# Patient Record
Sex: Male | Born: 1965 | Race: White | Hispanic: No | Marital: Married | Smoking: Current every day smoker
Health system: Southern US, Community
[De-identification: ages and names within clinical notes are randomized; demographics above are authoritative.]

## PROBLEM LIST (undated history)

## (undated) DIAGNOSIS — G4730 Sleep apnea, unspecified: Secondary | ICD-10-CM

## (undated) DIAGNOSIS — I10 Essential (primary) hypertension: Secondary | ICD-10-CM

## (undated) HISTORY — PX: ESOPHAGOGASTRODUODENOSCOPY (EGD) WITH ESOPHAGEAL DILATION: SHX5812

---

## 2012-12-31 ENCOUNTER — Other Ambulatory Visit: Payer: Self-pay | Admitting: Neurosurgery

## 2013-01-31 ENCOUNTER — Encounter (HOSPITAL_COMMUNITY): Payer: Self-pay

## 2013-02-02 ENCOUNTER — Encounter (HOSPITAL_COMMUNITY): Payer: Self-pay

## 2013-02-02 NOTE — Pre-Procedure Instructions (Signed)
Ronalee Beltsric Monterosso  02/02/2013   Your procedure is scheduled on:  Monday, February 14, 2013  [facility] [address] at [employer] AM.  Call this number if you have problems the morning of surgery: [phone]   Remember:   Do not eat food or drink liquids after midnight.   Take these medicines the morning of surgery with A SIP OF WATER: amlodipine (Norvasc) and Omeprazole  STOP taking Aspirin, Goody's, BC's, Aleve (Naproxen), Ibuprofen (Advil or Motrin), Fish Oil, Vitamins, Herbal Supplements or any substance that could thin your blood starting Monday, February 07, 2013   Do not wear jewelry.  Do not wear lotions or powders. You may wear deodorant.  Do not shave 48 hours prior to surgery. Men may shave face and neck.  Do not bring valuables to the hospital.  [facility] is not responsible for any belongings or valuables.               Contacts, dentures or bridgework may not be worn into surgery.  Leave suitcase in the car. After surgery it may be brought to your room.  For patients admitted to the hospital, discharge time is determined by your treatment team.                 Special Instructions: Shower using CHG 2 nights before surgery and the night before surgery.  If you shower the day of surgery use CHG.  Use special wash - you have one bottle of CHG for all showers.  You should use approximately 1/3 of the bottle for each shower.   Please read over the following fact sheets that you were given: Pain Booklet, Coughing and Deep Breathing, MRSA Information and Surgical Site Infection Prevention

## 2013-02-03 ENCOUNTER — Encounter (HOSPITAL_COMMUNITY)
Admission: RE | Admit: 2013-02-03 | Discharge: 2013-02-03 | Disposition: A | Discharge status: Home / Self Care | Payer: BC Managed Care – PPO | Source: Ambulatory Visit | Attending: Neurosurgery | Admitting: Neurosurgery

## 2013-02-03 ENCOUNTER — Encounter (HOSPITAL_COMMUNITY)
Admission: RE | Admit: 2013-02-03 | Discharge: 2013-02-03 | Disposition: A | Discharge status: Home / Self Care | Payer: BC Managed Care – PPO | Source: Ambulatory Visit | Attending: Anesthesiology | Admitting: Anesthesiology

## 2013-02-03 ENCOUNTER — Encounter (HOSPITAL_COMMUNITY): Payer: Self-pay

## 2013-02-03 DIAGNOSIS — Z01818 Encounter for other preprocedural examination: Secondary | ICD-10-CM | POA: Insufficient documentation

## 2013-02-03 DIAGNOSIS — Z01812 Encounter for preprocedural laboratory examination: Secondary | ICD-10-CM | POA: Insufficient documentation

## 2013-02-03 HISTORY — DX: Sleep apnea, unspecified: G47.30

## 2013-02-03 HISTORY — DX: Essential (primary) hypertension: I10

## 2013-02-03 LAB — BASIC METABOLIC PANEL
BUN: 13 mg/dL (ref 6–23)
CALCIUM: 9.7 mg/dL (ref 8.4–10.5)
CO2: 29 mEq/L (ref 19–32)
Chloride: 100 mEq/L (ref 96–112)
Creatinine, Ser: 1.04 mg/dL (ref 0.50–1.35)
GFR, EST NON AFRICAN AMERICAN: 85 mL/min — AB (ref >90)
GLUCOSE: 102 mg/dL — AB (ref 70–99)
POTASSIUM: 4.4 meq/L (ref 3.7–5.3)
Sodium: 142 mEq/L (ref 137–147)

## 2013-02-03 LAB — SURGICAL PCR SCREEN
MRSA, PCR: NEGATIVE
Staphylococcus aureus: NEGATIVE

## 2013-02-03 LAB — CBC
HCT: 47.9 % (ref 39.0–52.0)
HEMOGLOBIN: 17.2 g/dL — AB (ref 13.0–17.0)
MCH: 32.3 pg (ref 26.0–34.0)
MCHC: 35.9 g/dL (ref 30.0–36.0)
MCV: 90 fL (ref 78.0–100.0)
PLATELETS: 283 10*3/uL (ref 150–400)
RBC: 5.32 MIL/uL (ref 4.22–5.81)
RDW: 13.1 % (ref 11.5–15.5)
WBC: 8.4 10*3/uL (ref 4.0–10.5)

## 2013-02-03 NOTE — Progress Notes (Signed)
   Pt sees Dr. Carole BinningMeadows, Cardiologist, saw 2 days ago with EKG done which was requested.  Pt denies CXR, stress test, ECHO or cardiac cath.

## 2013-02-13 MED ORDER — CEFAZOLIN SODIUM-DEXTROSE 2-3 GM-% IV SOLR
2.0000 g | INTRAVENOUS | Status: AC
  Administered 2013-02-14: 2 g via INTRAVENOUS
  Filled 2013-02-13: qty 50

## 2013-02-14 ENCOUNTER — Encounter (HOSPITAL_COMMUNITY)
Admission: RE | Disposition: A | Discharge status: Home / Self Care | Payer: Self-pay | Source: Ambulatory Visit | Attending: Neurosurgery

## 2013-02-14 ENCOUNTER — Encounter (HOSPITAL_COMMUNITY): Payer: Self-pay

## 2013-02-14 ENCOUNTER — Inpatient Hospital Stay (HOSPITAL_COMMUNITY): Payer: BC Managed Care – PPO

## 2013-02-14 ENCOUNTER — Encounter (HOSPITAL_COMMUNITY): Payer: BC Managed Care – PPO | Admitting: Certified Registered Nurse Anesthetist

## 2013-02-14 ENCOUNTER — Inpatient Hospital Stay (HOSPITAL_COMMUNITY): Payer: BC Managed Care – PPO | Admitting: Certified Registered Nurse Anesthetist

## 2013-02-14 ENCOUNTER — Ambulatory Visit (HOSPITAL_COMMUNITY)
Admission: RE | Admit: 2013-02-14 | Discharge: 2013-02-15 | Disposition: A | Discharge status: Home / Self Care | Payer: BC Managed Care – PPO | Source: Ambulatory Visit | Attending: Neurosurgery | Admitting: Neurosurgery

## 2013-02-14 DIAGNOSIS — Z9119 Patient's noncompliance with other medical treatment and regimen: Secondary | ICD-10-CM | POA: Insufficient documentation

## 2013-02-14 DIAGNOSIS — Q762 Congenital spondylolisthesis: Principal | ICD-10-CM | POA: Insufficient documentation

## 2013-02-14 DIAGNOSIS — K219 Gastro-esophageal reflux disease without esophagitis: Secondary | ICD-10-CM | POA: Insufficient documentation

## 2013-02-14 DIAGNOSIS — G4730 Sleep apnea, unspecified: Secondary | ICD-10-CM | POA: Insufficient documentation

## 2013-02-14 DIAGNOSIS — F17200 Nicotine dependence, unspecified, uncomplicated: Secondary | ICD-10-CM | POA: Insufficient documentation

## 2013-02-14 DIAGNOSIS — M48061 Spinal stenosis, lumbar region without neurogenic claudication: Secondary | ICD-10-CM | POA: Insufficient documentation

## 2013-02-14 DIAGNOSIS — M4316 Spondylolisthesis, lumbar region: Secondary | ICD-10-CM

## 2013-02-14 DIAGNOSIS — E669 Obesity, unspecified: Secondary | ICD-10-CM | POA: Insufficient documentation

## 2013-02-14 DIAGNOSIS — Z91199 Patient's noncompliance with other medical treatment and regimen due to unspecified reason: Secondary | ICD-10-CM | POA: Insufficient documentation

## 2013-02-14 DIAGNOSIS — I10 Essential (primary) hypertension: Secondary | ICD-10-CM | POA: Insufficient documentation

## 2013-02-14 HISTORY — PX: MAXIMUM ACCESS (MAS)POSTERIOR LUMBAR INTERBODY FUSION (PLIF) 1 LEVEL: SHX6368

## 2013-02-14 LAB — ABO/RH: ABO/RH(D): B POS

## 2013-02-14 LAB — TYPE AND SCREEN
ABO/RH(D): B POS
Antibody Screen: NEGATIVE

## 2013-02-14 SURGERY — FOR MAXIMUM ACCESS (MAS) POSTERIOR LUMBAR INTERBODY FUSION (PLIF) 1 LEVEL
Anesthesia: General

## 2013-02-14 MED ORDER — OXYCODONE HCL 5 MG/5ML PO SOLN: 5.0000 mg | Freq: Once | ORAL | Status: AC | PRN

## 2013-02-14 MED ORDER — HYDROMORPHONE HCL PF 1 MG/ML IJ SOLN
0.2500 mg | INTRAMUSCULAR | Status: DC | PRN
  Administered 2013-02-14 (×4): 0.5 mg via INTRAVENOUS

## 2013-02-14 MED ORDER — LIDOCAINE HCL (CARDIAC) 20 MG/ML IV SOLN
INTRAVENOUS | Status: DC | PRN
Start: 2013-02-14 — End: 2013-02-14
  Administered 2013-02-14: 100 mg via INTRAVENOUS

## 2013-02-14 MED ORDER — HYDROCODONE-ACETAMINOPHEN 10-325 MG PO TABS: 2.0000 | ORAL_TABLET | Freq: Four times a day (QID) | ORAL | Status: DC | PRN

## 2013-02-14 MED ORDER — DOCUSATE SODIUM 100 MG PO CAPS
100.0000 mg | ORAL_CAPSULE | Freq: Two times a day (BID) | ORAL | Status: DC
  Administered 2013-02-14: 100 mg via ORAL
  Filled 2013-02-14 (×3): qty 1

## 2013-02-14 MED ORDER — POTASSIUM CHLORIDE IN NACL 20-0.9 MEQ/L-% IV SOLN
INTRAVENOUS | Status: DC
  Administered 2013-02-14: 19:00:00 via INTRAVENOUS
  Filled 2013-02-14 (×3): qty 1000

## 2013-02-14 MED ORDER — CYCLOBENZAPRINE HCL 10 MG PO TABS: 10.0000 mg | ORAL_TABLET | Freq: Three times a day (TID) | ORAL | Status: DC | PRN

## 2013-02-14 MED ORDER — CEFAZOLIN SODIUM 1-5 GM-% IV SOLN
INTRAVENOUS | Status: AC
  Filled 2013-02-14: qty 50

## 2013-02-14 MED ORDER — PHENOL 1.4 % MT LIQD: 1.0000 | OROMUCOSAL | Status: DC | PRN

## 2013-02-14 MED ORDER — ONDANSETRON HCL 4 MG/2ML IJ SOLN: 4.0000 mg | INTRAMUSCULAR | Status: DC | PRN

## 2013-02-14 MED ORDER — HYDROCHLOROTHIAZIDE 25 MG PO TABS
25.0000 mg | ORAL_TABLET | Freq: Every day | ORAL | Status: DC
  Filled 2013-02-14: qty 1

## 2013-02-14 MED ORDER — MEPERIDINE HCL 25 MG/ML IJ SOLN: 6.2500 mg | INTRAMUSCULAR | Status: DC | PRN

## 2013-02-14 MED ORDER — 0.9 % SODIUM CHLORIDE (POUR BTL) OPTIME
TOPICAL | Status: DC | PRN
  Administered 2013-02-14: 1000 mL

## 2013-02-14 MED ORDER — SODIUM CHLORIDE 0.9 % IJ SOLN
3.0000 mL | Freq: Two times a day (BID) | INTRAMUSCULAR | Status: DC
  Administered 2013-02-14: 3 mL via INTRAVENOUS

## 2013-02-14 MED ORDER — THROMBIN 20000 UNITS EX SOLR
CUTANEOUS | Status: DC | PRN
  Administered 2013-02-14: 08:00:00 via TOPICAL

## 2013-02-14 MED ORDER — SODIUM CHLORIDE 0.9 % IJ SOLN: 3.0000 mL | INTRAMUSCULAR | Status: DC | PRN

## 2013-02-14 MED ORDER — ALUM & MAG HYDROXIDE-SIMETH 200-200-20 MG/5ML PO SUSP: 30.0000 mL | Freq: Four times a day (QID) | ORAL | Status: DC | PRN

## 2013-02-14 MED ORDER — HYDROMORPHONE HCL PF 1 MG/ML IJ SOLN: 0.5000 mg | INTRAMUSCULAR | Status: DC | PRN

## 2013-02-14 MED ORDER — ONDANSETRON HCL 4 MG/2ML IJ SOLN
INTRAMUSCULAR | Status: DC | PRN
  Administered 2013-02-14: 4 mg via INTRAVENOUS

## 2013-02-14 MED ORDER — PANTOPRAZOLE SODIUM 40 MG PO TBEC: 40.0000 mg | DELAYED_RELEASE_TABLET | Freq: Every day | ORAL | Status: DC

## 2013-02-14 MED ORDER — LOSARTAN POTASSIUM 50 MG PO TABS
100.0000 mg | ORAL_TABLET | Freq: Every day | ORAL | Status: DC
  Filled 2013-02-14: qty 2

## 2013-02-14 MED ORDER — OXYCODONE-ACETAMINOPHEN 5-325 MG PO TABS
1.0000 | ORAL_TABLET | ORAL | Status: DC | PRN
  Administered 2013-02-14 – 2013-02-15 (×3): 2 via ORAL
  Filled 2013-02-14 (×3): qty 2

## 2013-02-14 MED ORDER — LIDOCAINE-EPINEPHRINE 1 %-1:100000 IJ SOLN
INTRAMUSCULAR | Status: DC | PRN
  Administered 2013-02-14: 4.5 mL

## 2013-02-14 MED ORDER — MENTHOL 3 MG MT LOZG: 1.0000 | LOZENGE | OROMUCOSAL | Status: DC | PRN

## 2013-02-14 MED ORDER — FENTANYL CITRATE 0.05 MG/ML IJ SOLN
INTRAMUSCULAR | Status: DC | PRN
  Administered 2013-02-14: 250 ug via INTRAVENOUS
  Administered 2013-02-14: 50 ug via INTRAVENOUS
  Administered 2013-02-14: 75 ug via INTRAVENOUS
  Administered 2013-02-14 (×2): 50 ug via INTRAVENOUS
  Administered 2013-02-14: 100 ug via INTRAVENOUS

## 2013-02-14 MED ORDER — THROMBIN 5000 UNITS EX SOLR
OROMUCOSAL | Status: DC | PRN
  Administered 2013-02-14: 08:00:00 via TOPICAL

## 2013-02-14 MED ORDER — MIDAZOLAM HCL 5 MG/5ML IJ SOLN
INTRAMUSCULAR | Status: DC | PRN
  Administered 2013-02-14: 2 mg via INTRAVENOUS

## 2013-02-14 MED ORDER — DEXAMETHASONE SODIUM PHOSPHATE 4 MG/ML IJ SOLN
INTRAMUSCULAR | Status: DC | PRN
  Administered 2013-02-14: 8 mg via INTRAVENOUS

## 2013-02-14 MED ORDER — ACETAMINOPHEN 650 MG RE SUPP: 650.0000 mg | RECTAL | Status: DC | PRN

## 2013-02-14 MED ORDER — CEFAZOLIN SODIUM 1-5 GM-% IV SOLN
1.0000 g | Freq: Three times a day (TID) | INTRAVENOUS | Status: AC
  Administered 2013-02-14 (×2): 1 g via INTRAVENOUS
  Filled 2013-02-14: qty 50

## 2013-02-14 MED ORDER — ARTIFICIAL TEARS OP OINT
TOPICAL_OINTMENT | OPHTHALMIC | Status: DC | PRN
  Administered 2013-02-14: 1 via OPHTHALMIC

## 2013-02-14 MED ORDER — HYDROMORPHONE HCL PF 1 MG/ML IJ SOLN
INTRAMUSCULAR | Status: AC
  Filled 2013-02-14: qty 1

## 2013-02-14 MED ORDER — MIDAZOLAM HCL 2 MG/2ML IJ SOLN: 0.5000 mg | Freq: Once | INTRAMUSCULAR | Status: DC | PRN

## 2013-02-14 MED ORDER — SODIUM CHLORIDE 0.9 % IR SOLN
Status: DC | PRN
  Administered 2013-02-14: 08:00:00

## 2013-02-14 MED ORDER — PROMETHAZINE HCL 25 MG/ML IJ SOLN: 6.2500 mg | INTRAMUSCULAR | Status: DC | PRN

## 2013-02-14 MED ORDER — PROPOFOL INFUSION 10 MG/ML OPTIME
INTRAVENOUS | Status: DC | PRN
  Administered 2013-02-14: 50 ug/kg/min via INTRAVENOUS

## 2013-02-14 MED ORDER — LACTATED RINGERS IV SOLN
INTRAVENOUS | Status: DC | PRN
  Administered 2013-02-14 (×3): via INTRAVENOUS

## 2013-02-14 MED ORDER — PROPOFOL 10 MG/ML IV BOLUS
INTRAVENOUS | Status: DC | PRN
  Administered 2013-02-14: 200 mg via INTRAVENOUS

## 2013-02-14 MED ORDER — OXYCODONE HCL 5 MG PO TABS
ORAL_TABLET | ORAL | Status: AC
  Filled 2013-02-14: qty 1

## 2013-02-14 MED ORDER — LOSARTAN POTASSIUM-HCTZ 100-25 MG PO TABS: 1.0000 | ORAL_TABLET | Freq: Every day | ORAL | Status: DC

## 2013-02-14 MED ORDER — SUCCINYLCHOLINE CHLORIDE 20 MG/ML IJ SOLN
INTRAMUSCULAR | Status: DC | PRN
  Administered 2013-02-14: 100 mg via INTRAVENOUS

## 2013-02-14 MED ORDER — OMEPRAZOLE MAGNESIUM 20.6 (20 BASE) MG PO CPDR: 20.6000 mg | DELAYED_RELEASE_CAPSULE | Freq: Every day | ORAL | Status: DC

## 2013-02-14 MED ORDER — AMLODIPINE BESYLATE 10 MG PO TABS
10.0000 mg | ORAL_TABLET | Freq: Every day | ORAL | Status: DC
  Filled 2013-02-14: qty 1

## 2013-02-14 MED ORDER — OXYCODONE HCL 5 MG PO TABS
5.0000 mg | ORAL_TABLET | Freq: Once | ORAL | Status: AC | PRN
  Administered 2013-02-14: 5 mg via ORAL

## 2013-02-14 MED ORDER — BUPIVACAINE HCL (PF) 0.25 % IJ SOLN
INTRAMUSCULAR | Status: DC | PRN
  Administered 2013-02-14: 4.5 mL

## 2013-02-14 MED ORDER — ACETAMINOPHEN 325 MG PO TABS: 650.0000 mg | ORAL_TABLET | ORAL | Status: DC | PRN

## 2013-02-14 SURGICAL SUPPLY — 69 items
BAG DECANTER FOR FLEXI CONT (MISCELLANEOUS) ×2 IMPLANT
BENZOIN TINCTURE PRP APPL 2/3 (GAUZE/BANDAGES/DRESSINGS) ×2 IMPLANT
BLADE SURG ROTATE 9660 (MISCELLANEOUS) IMPLANT
BONE MATRIX OSTEOCEL PRO SM (Bone Implant) ×4 IMPLANT
BUR MATCHSTICK NEURO 3.0 LAGG (BURR) ×2 IMPLANT
CAGE COROENT 14X9X28-8 (Cage) ×4 IMPLANT
CANISTER SUCT 3000ML (MISCELLANEOUS) ×2 IMPLANT
CLIP NEUROVISION LG (CLIP) ×2 IMPLANT
CONT SPEC 4OZ CLIKSEAL STRL BL (MISCELLANEOUS) ×4 IMPLANT
COVER BACK TABLE 24X17X13 BIG (DRAPES) IMPLANT
COVER TABLE BACK 60X90 (DRAPES) ×2 IMPLANT
DERMABOND ADHESIVE PROPEN (GAUZE/BANDAGES/DRESSINGS) ×1
DERMABOND ADVANCED .7 DNX6 (GAUZE/BANDAGES/DRESSINGS) ×1 IMPLANT
DRAPE C-ARM 42X72 X-RAY (DRAPES) ×2 IMPLANT
DRAPE C-ARMOR (DRAPES) ×2 IMPLANT
DRAPE LAPAROTOMY 100X72X124 (DRAPES) ×2 IMPLANT
DRAPE POUCH INSTRU U-SHP 10X18 (DRAPES) ×2 IMPLANT
DRAPE SURG 17X23 STRL (DRAPES) ×2 IMPLANT
DRESSING TELFA 8X3 (GAUZE/BANDAGES/DRESSINGS) ×2 IMPLANT
DRSG OPSITE 4X5.5 SM (GAUZE/BANDAGES/DRESSINGS) ×2 IMPLANT
DRSG OPSITE POSTOP 4X6 (GAUZE/BANDAGES/DRESSINGS) ×2 IMPLANT
DURAPREP 26ML APPLICATOR (WOUND CARE) ×2 IMPLANT
ELECT REM PT RETURN 9FT ADLT (ELECTROSURGICAL) ×2
ELECTRODE REM PT RTRN 9FT ADLT (ELECTROSURGICAL) ×1 IMPLANT
EVACUATOR 1/8 PVC DRAIN (DRAIN) ×2 IMPLANT
GAUZE SPONGE 4X4 16PLY XRAY LF (GAUZE/BANDAGES/DRESSINGS) IMPLANT
GLOVE BIO SURGEON STRL SZ8 (GLOVE) ×4 IMPLANT
GLOVE ECLIPSE 7.5 STRL STRAW (GLOVE) ×2 IMPLANT
GLOVE EXAM NITRILE LRG STRL (GLOVE) ×4 IMPLANT
GLOVE INDICATOR 7.0 STRL GRN (GLOVE) ×2 IMPLANT
GLOVE INDICATOR 8.5 STRL (GLOVE) ×2 IMPLANT
GLOVE SURG SS PI 6.5 STRL IVOR (GLOVE) ×6 IMPLANT
GOWN BRE IMP SLV AUR LG STRL (GOWN DISPOSABLE) IMPLANT
GOWN BRE IMP SLV AUR XL STRL (GOWN DISPOSABLE) IMPLANT
GOWN STRL REIN 2XL LVL4 (GOWN DISPOSABLE) IMPLANT
GOWN STRL REUS W/ TWL XL LVL3 (GOWN DISPOSABLE) ×2 IMPLANT
GOWN STRL REUS W/TWL XL LVL3 (GOWN DISPOSABLE) ×2
HEMOSTAT POWDER KIT SURGIFOAM (HEMOSTASIS) IMPLANT
KIT BASIN OR (CUSTOM PROCEDURE TRAY) ×2 IMPLANT
KIT NEEDLE NVM5 EMG ELECT (KITS) ×1 IMPLANT
KIT NEEDLE NVM5 EMG ELECTRODE (KITS) ×1
KIT ROOM TURNOVER OR (KITS) ×2 IMPLANT
MILL MEDIUM DISP (BLADE) ×2 IMPLANT
NEEDLE HYPO 25X1 1.5 SAFETY (NEEDLE) ×2 IMPLANT
NS IRRIG 1000ML POUR BTL (IV SOLUTION) ×2 IMPLANT
PACK LAMINECTOMY NEURO (CUSTOM PROCEDURE TRAY) ×2 IMPLANT
PAD ARMBOARD 7.5X6 YLW CONV (MISCELLANEOUS) ×6 IMPLANT
PUTTY BONE DBX 2.5 MIS (Bone Implant) ×2 IMPLANT
ROD 5.5X40MM (Rod) ×4 IMPLANT
SCREW LOCK (Screw) ×4 IMPLANT
SCREW LOCK FXNS SPNE MAS PL (Screw) ×4 IMPLANT
SCREW PLIF MAS 5.0X35 (Screw) ×4 IMPLANT
SCREW SHANK 5.0X35 (Screw) ×4 IMPLANT
SCREW TULIP 5.5 (Screw) ×4 IMPLANT
SPONGE GAUZE 4X4 12PLY (GAUZE/BANDAGES/DRESSINGS) ×2 IMPLANT
SPONGE LAP 4X18 X RAY DECT (DISPOSABLE) IMPLANT
SPONGE SURGIFOAM ABS GEL 100 (HEMOSTASIS) ×2 IMPLANT
STRIP CLOSURE SKIN 1/2X4 (GAUZE/BANDAGES/DRESSINGS) ×2 IMPLANT
SUT VIC AB 0 CT1 18XCR BRD8 (SUTURE) ×1 IMPLANT
SUT VIC AB 0 CT1 8-18 (SUTURE) ×1
SUT VIC AB 2-0 CT1 18 (SUTURE) ×2 IMPLANT
SUT VICRYL 4-0 PS2 18IN ABS (SUTURE) ×2 IMPLANT
SYR 20ML ECCENTRIC (SYRINGE) ×2 IMPLANT
TAPE CLOTH SURG 4X10 WHT LF (GAUZE/BANDAGES/DRESSINGS) ×2 IMPLANT
TOWEL OR 17X24 6PK STRL BLUE (TOWEL DISPOSABLE) ×2 IMPLANT
TOWEL OR 17X26 10 PK STRL BLUE (TOWEL DISPOSABLE) ×2 IMPLANT
TRAY FOLEY CATH 14FRSI W/METER (CATHETERS) ×2 IMPLANT
TRAY FOLEY CATH 16FRSI W/METER (SET/KITS/TRAYS/PACK) ×2 IMPLANT
WATER STERILE IRR 1000ML POUR (IV SOLUTION) ×2 IMPLANT

## 2013-02-14 NOTE — Transfer of Care (Signed)
Immediate Anesthesia Transfer of Care Note  Patient: Alex Mason  Procedure(s) Performed: Procedure(s) with comments: FOR MAXIMUM ACCESS (MAS) LUMBAR FOUR TO LUMBAR FIVE POSTERIOR LUMBAR INTERBODY FUSION (PLIF) 1 LEVEL (N/A) - L4-5 MAS Posterior lumbar interbody fusion  Patient Location: PACU  Anesthesia Type:General  Level of Consciousness: sedated and responds to stimulation  Airway & Oxygen Therapy: Patient Spontanous Breathing and Patient connected to face mask oxygen  Post-op Assessment: Report given to PACU RN, Post -op Vital signs reviewed and stable and Patient moving all extremities X 4  Post vital signs: Reviewed and stable  Complications: No apparent anesthesia complications

## 2013-02-14 NOTE — Anesthesia Preprocedure Evaluation (Addendum)
Anesthesia Evaluation  Patient identified by MRN, date of birth, ID band Patient awake    Reviewed: Allergy & Precautions, H&P , NPO status , Patient's Chart, lab work & pertinent test results  History of Anesthesia Complications Negative for: history of anesthetic complications  Airway Mallampati: I TM Distance: >3 FB Neck ROM: Full    Dental  (+) Dental Advisory Given and Teeth Intact   Pulmonary sleep apnea (does not use CPAP) , Current Smoker,  Noncompliant with CPAP breath sounds clear to auscultation  Pulmonary exam normal       Cardiovascular hypertension, Pt. on medications Rhythm:Regular Rate:Normal     Neuro/Psych LLE numbness, tingling, weakness    GI/Hepatic Neg liver ROS, GERD-  Medicated and Controlled,H/o esophageal dilatation--2010   Endo/Other  negative endocrine ROSMorbid obesity  Renal/GU negative Renal ROS  negative genitourinary   Musculoskeletal negative musculoskeletal ROS (+)   Abdominal (+) + obese,   Peds  Hematology negative hematology ROS (+)   Anesthesia Other Findings   Reproductive/Obstetrics                          Anesthesia Physical Anesthesia Plan  ASA: III  Anesthesia Plan: General   Post-op Pain Management:    Induction: Intravenous  Airway Management Planned: Oral ETT  Additional Equipment:   Intra-op Plan:   Post-operative Plan: Extubation in OR  Informed Consent: I have reviewed the patients History and Physical, chart, labs and discussed the procedure including the risks, benefits and alternatives for the proposed anesthesia with the patient or authorized representative who has indicated his/her understanding and acceptance.   Dental advisory given  Plan Discussed with: CRNA, Anesthesiologist and Surgeon  Anesthesia Plan Comments: (Plan routine monitors, GETA)       Anesthesia Quick Evaluation

## 2013-02-14 NOTE — H&P (Signed)
Alex Mason is an 48 y.o. male.   Chief Complaint: Back and left leg pain HPI: Patient is a very pleasant 48 year old gentleman who has had progressive worsening back and predominantly right leg pain rate down the back of his leg back of his calf and stop his foot his big toe. On clinical exam patient had weakness in his great toe patient been refractory to all forms of conservative treatment imaging revealed severe stenosis lateral recess stenosis from a grade 1 spondylolisthesis L4-5 and due to patient's failure conservative treatment progression of clinical syndrome and imaging findings have recommended decompression stabilization procedure at L4-5 I extensively reviewed the risks and benefits of the operation with the patient as well as perioperative course and expectations of outcome alternatives of surgery he understands and agrees to proceed forward.  Past Medical History  Diagnosis Date  . Hypertension   . Sleep apnea     +sleep apnea, not able to be successful with CPAP    Past Surgical History  Procedure Laterality Date  . Esophagogastroduodenoscopy (egd) with esophageal dilation      No family history on file. Social History:  reports that he has been smoking Cigarettes.  He has been smoking about 1.50 packs per day. He has never used smokeless tobacco. He reports that he drinks about 1.2 ounces of alcohol per week. He reports that he does not use illicit drugs.  Allergies: No Known Allergies  Medications Prior to Admission  Medication Sig Dispense Refill  . amLODipine (NORVASC) 10 MG tablet Take 10 mg by mouth daily.      Marland Kitchen HYDROcodone-acetaminophen (NORCO) 10-325 MG per tablet Take 2 tablets by mouth every 6 (six) hours as needed.      Marland Kitchen losartan-hydrochlorothiazide (HYZAAR) 100-25 MG per tablet Take 1 tablet by mouth daily.      . Omeprazole Magnesium 20.6 (20 BASE) MG CPDR Take 20.6 mg by mouth daily.        Results for orders placed during the hospital encounter of  02/14/13 (from the past 48 hour(s))  TYPE AND SCREEN     Status: None   Collection Time    02/14/13  6:13 AM      Result Value Range   ABO/RH(D) B POS     Antibody Screen PENDING     Sample Expiration 02/17/2013     No results found.  Review of Systems  Constitutional: Negative.   HENT: Negative.   Eyes: Negative.   Respiratory: Negative.   Cardiovascular: Negative.   Gastrointestinal: Negative.   Genitourinary: Negative.   Musculoskeletal: Positive for back pain and myalgias.  Skin: Negative.   Neurological: Positive for tingling and sensory change.  Psychiatric/Behavioral: Negative.     Blood pressure 127/90, pulse 81, temperature 97.6 F (36.4 C), resp. rate 20, SpO2 98.00%. Physical Exam  Constitutional: He is oriented to person, place, and time. He appears well-developed and well-nourished.  Eyes: Pupils are equal, round, and reactive to light.  Neck: Normal range of motion.  Respiratory: Effort normal.  GI: Soft.  Neurological: He is alert and oriented to person, place, and time. He has normal strength. GCS eye subscore is 4. GCS verbal subscore is 5. GCS motor subscore is 6.  Reflex Scores:      Patellar reflexes are 0 on the right side and 0 on the left side.      Achilles reflexes are 0 on the right side and 0 on the left side. Strength is 5 out of 5 in his  iliopsoas, quads, and she's, gastrocs, the left EHL is weak at 4/5 the right EHL is 5 out of 5  Skin: Skin is warm and dry.     Assessment/Plan 48 year old presents for decompression stabilization procedure at L4-5  Hollynn Garno P 02/14/2013, 7:13 AM

## 2013-02-14 NOTE — Preoperative (Signed)
Beta Blockers   Reason not to administer Beta Blockers:Not Applicable 

## 2013-02-14 NOTE — Progress Notes (Signed)
Orthopedic Tech Progress Note Patient Details:  Alex Mason 1965-12-21 [id number] CalledRonalee Mason bio-tech for brace Patient ID: Alex BeltsEric Mason, male   DOB: 1965-12-21, 48 y.o.   MRN: [id number]   Alex MoccasinHughes, Alex Zietz [name] 02/14/2013, 4:37 PM

## 2013-02-14 NOTE — Plan of Care (Signed)
Problem: Consults Goal: Diagnosis - Spinal Surgery Outcome: Completed/Met Date Met:  02/14/13 Thoraco/Lumbar Spine Fusion

## 2013-02-14 NOTE — Op Note (Signed)
Preoperative diagnosis: Grade 1 spondylolisthesis and lumbar spinal stenosis L4-5 left-sided L4 and L5 radiculopathy  Postoperative diagnosis: Same  Procedure: Decompressive lumbar laminectomy and posterior lumbar interbody fusion L4-5 using the invasive peek cages packed with local autograft mixed with DBX and OsteoSet less than and and cortical screw fixation using a 5.5 invasive cortical screw system with 50 by 35 mm cortical screws at L4 and L5  Surgeon: Jillyn Hidden Roseanna Koplin  Assistant: Colon Branch  Anesthesia: Gen.  EBL: Minimal  History of present illness: Patient is a very pleasant 47 to gentleman has had progressive worsening back and left greater right leg pain patient reports pain that would radiate down, and L4 and L5 nerve root pattern had weakness in his left great toe on physical exam and failed all forms of conservative treatments are recommended decompression stabilization procedure I extensively reviewed the risks and benefits of the operation the patient as well as perioperative course and expectations of outcome and alternatives surgery he understood and agreed to proceed forward.  Operative procedure: Patient brought into the or was induced under general anesthesia positioned prone on the Wilson frame his back was prepped and draped in routine sterile fashion preoperative x-ray localize the appropriate level so after infiltration of 10 cc lidocaine with epi over the cover C6 and subcutaneous tissues and subperiosteal dissections care lamina of L4 and expose the facet complexes at L3-4 and L4-5. The entry points were first identified along the lateral wall the pars NA 5:00 and 7:00 position respectively into the L4 pedicle. Using APL and lateral fluoroscopy pilot holes were drilled and under direct neural stimulation to 35 mm cortical screw holes were drilled tapped probed and 50 by 35 mm cortical screws inserted L4 bilaterally at this point the inferior aspect approximately 2 mm inferior  screw of the pars was drilled away to marked the superior endplate of decompression complete central decompression was begun with complete medial facetectomies the foramina at L4 and L5 were identified and the L4 and L5 nerve roots are radically decompressed and extensively under biting the superior tickling facet of L5 to gain access lateral margins of the disc space. The spaces and cleaned out with 200 stretching curettes rotating cutters and rasps after adequate endplate preparation been achieved and with the distractor in place in appropriate sizing for the graft after placement of a trial confirmed to be a 14 mm 8 lordosis implant with 20 mm in length this is packed with local autograft mixed with DBX and OsteoSet plus fluoroscopy used the step along the way after the initial implant and placed the disc spaces cleanout the opposite side as well as all central endplate local autograft mixed DBX OsteoSet was packed centrally and then the contralateral cage was inserted additional bone graft mixed was packed laterally to the cage and then attention second of the L5 screw placement using a strata trajectory under direct visualization of the medial pedicle wall directing slightly mediolateral L5 cortical screws were also placed and again 50 by 35 mm screws are placed the anterolisthesis screws were placed AP lateral fluoroscopy confirmed good position of the implants the wounds and copiously are good fixing space was maintained all the foraminal reinspected confirm patency no migration of graft material the head were assembled 40 mm rods were placed top tightness tightened down in place. Gelfoam was laid up the dura and the muscle fascia of her pressure layers with after Vicryl and the skin was closed running 4 subcuticular benzoin Steri-Strips applied patient recovered  in stable condition. At the end of case on it counts sponge counts were correct.

## 2013-02-14 NOTE — Progress Notes (Signed)
Orthopedic Tech Progress Note Patient Details:  Alex Mason 02-24-1965 [id number] Brace order completed by bio-tech Patient ID: Alex Mason, male   DOB: 02-24-1965, 48 y.o.   MRN: [id number]   Jennye MoccasinHughes, Tatayana Beshears [name] 02/14/2013, 8:44 PM

## 2013-02-14 NOTE — Anesthesia Postprocedure Evaluation (Signed)
  Anesthesia Post-op Note  Patient: Alex Mason  Procedure(s) Performed: Procedure(s) with comments: FOR MAXIMUM ACCESS (MAS) LUMBAR FOUR TO LUMBAR FIVE POSTERIOR LUMBAR INTERBODY FUSION (PLIF) 1 LEVEL (N/A) - L4-5 MAS Posterior lumbar interbody fusion  Patient Location: PACU  Anesthesia Type:General  Level of Consciousness: awake, alert , oriented and patient cooperative  Airway and Oxygen Therapy: Patient Spontanous Breathing and Patient connected to nasal cannula oxygen  Post-op Pain: mild  Post-op Assessment: Post-op Vital signs reviewed, Patient's Cardiovascular Status Stable, Respiratory Function Stable, Patent Airway, No signs of Nausea or vomiting and Pain level controlled  Post-op Vital Signs: Reviewed and stable  Complications: No apparent anesthesia complications

## 2013-02-15 MED ORDER — CYCLOBENZAPRINE HCL 10 MG PO TABS: 10.0000 mg | ORAL_TABLET | Freq: Three times a day (TID) | ORAL | Status: AC | PRN

## 2013-02-15 MED ORDER — OXYCODONE-ACETAMINOPHEN 5-325 MG PO TABS: 1.0000 | ORAL_TABLET | ORAL | Status: AC | PRN

## 2013-02-15 NOTE — Progress Notes (Signed)
Pt. Alert and oriented,follows simple instructions, denies pain. Incision area without swelling, redness or S/S of infection. Voiding adequate clear yellow urine. Moving all extremities well and vitals stable and documented. Patient discharged home with spouse per order.  Lumbar surgery notes instructions given to patient and family member for home safety and precautions. Pt. and family stated understanding of instructions given.

## 2013-02-15 NOTE — Discharge Summary (Signed)
  Physician Discharge Summary  Patient ID: Alex Mason MRN: [id number] DOB/AGE: [birth year] 46 y.o.  Admit date: 02/14/2013 Discharge date: 02/15/2013  Admission Diagnoses: Grade 1 spondylolisthesis L4-5  Discharge Diagnoses: Same Active Problems:   Spondylolisthesis at L4-L5 level   Discharged Condition: good  Hospital Course: [facility] a decompressive laminectomy and fusion at L4-5 postoperatively patient did very well with recovered in the floor on the floor he was ambulating and voiding spontaneously tolerating rigid diet and was stable to be discharged home. Patient be stable was scheduled followup approximately 2 weeks the  Consults: Significant Diagnostic Studies: Treatments: Decompressive lumbar laminectomy and posterior lumbar interbody fusion L4-5 Discharge Exam: Blood pressure 117/79, pulse 70, temperature 97.6 F (36.4 C), temperature source Oral, resp. rate 16, SpO2 94.00%. Strength out of 5 wound clean and dry  Disposition: Home     Medication List         amLODipine 10 MG tablet  Commonly known as:  NORVASC  Take 10 mg by mouth daily.     cyclobenzaprine 10 MG tablet  Commonly known as:  FLEXERIL  Take 1 tablet (10 mg total) by mouth 3 (three) times daily as needed for muscle spasms.     HYDROcodone-acetaminophen 10-325 MG per tablet  Commonly known as:  NORCO  Take 2 tablets by mouth every 6 (six) hours as needed.     losartan-hydrochlorothiazide 100-25 MG per tablet  Commonly known as:  HYZAAR  Take 1 tablet by mouth daily.     Omeprazole Magnesium 20.6 (20 BASE) MG Cpdr  Take 20.6 mg by mouth daily.     oxyCODONE-acetaminophen 5-325 MG per tablet  Commonly known as:  PERCOCET/ROXICET  Take 1-2 tablets by mouth every 4 (four) hours as needed for moderate pain.           Follow-up Information   [facility]onette Grosso P, MD.   Specialty:  Neurosurgery   Contact information:   [address] [place] [id number] [phone]       Signed: Devynn Hessler P 02/15/2013, 6:39 AM

## 2013-02-15 NOTE — Discharge Instructions (Signed)

## 2013-02-15 NOTE — Progress Notes (Signed)
Patient ID: Alex Mason, male   DOB: March 03, 1965, 48 y.o.   MRN: [id number] Patient is doing great no leg pain back pain is well controlled on pills. He is ambulating and voiding  Strength is 5 out of 5 wound is clean and dry  DC'd his Hemovac discharge home

## 2013-02-18 ENCOUNTER — Encounter (HOSPITAL_COMMUNITY): Payer: Self-pay | Admitting: Neurosurgery

## 2013-11-23 ENCOUNTER — Other Ambulatory Visit (HOSPITAL_COMMUNITY): Payer: Self-pay | Admitting: Neurosurgery

## 2013-11-23 ENCOUNTER — Ambulatory Visit (HOSPITAL_COMMUNITY)
Admission: RE | Admit: 2013-11-23 | Discharge: 2013-11-23 | Disposition: A | Discharge status: Home / Self Care | Payer: BC Managed Care – PPO | Source: Ambulatory Visit | Attending: Vascular Surgery | Admitting: Vascular Surgery

## 2013-11-23 DIAGNOSIS — M79605 Pain in left leg: Secondary | ICD-10-CM | POA: Insufficient documentation

## 2013-11-23 DIAGNOSIS — R52 Pain, unspecified: Secondary | ICD-10-CM

## 2014-09-12 IMAGING — CR DG CHEST 2V
2 series · 2 of 2 positions shown · non-contrast
Comparison: None.

CLINICAL DATA: Preoperative image for lumbar spine procedure

EXAM:
CHEST  2 VIEW

[w chest pa]
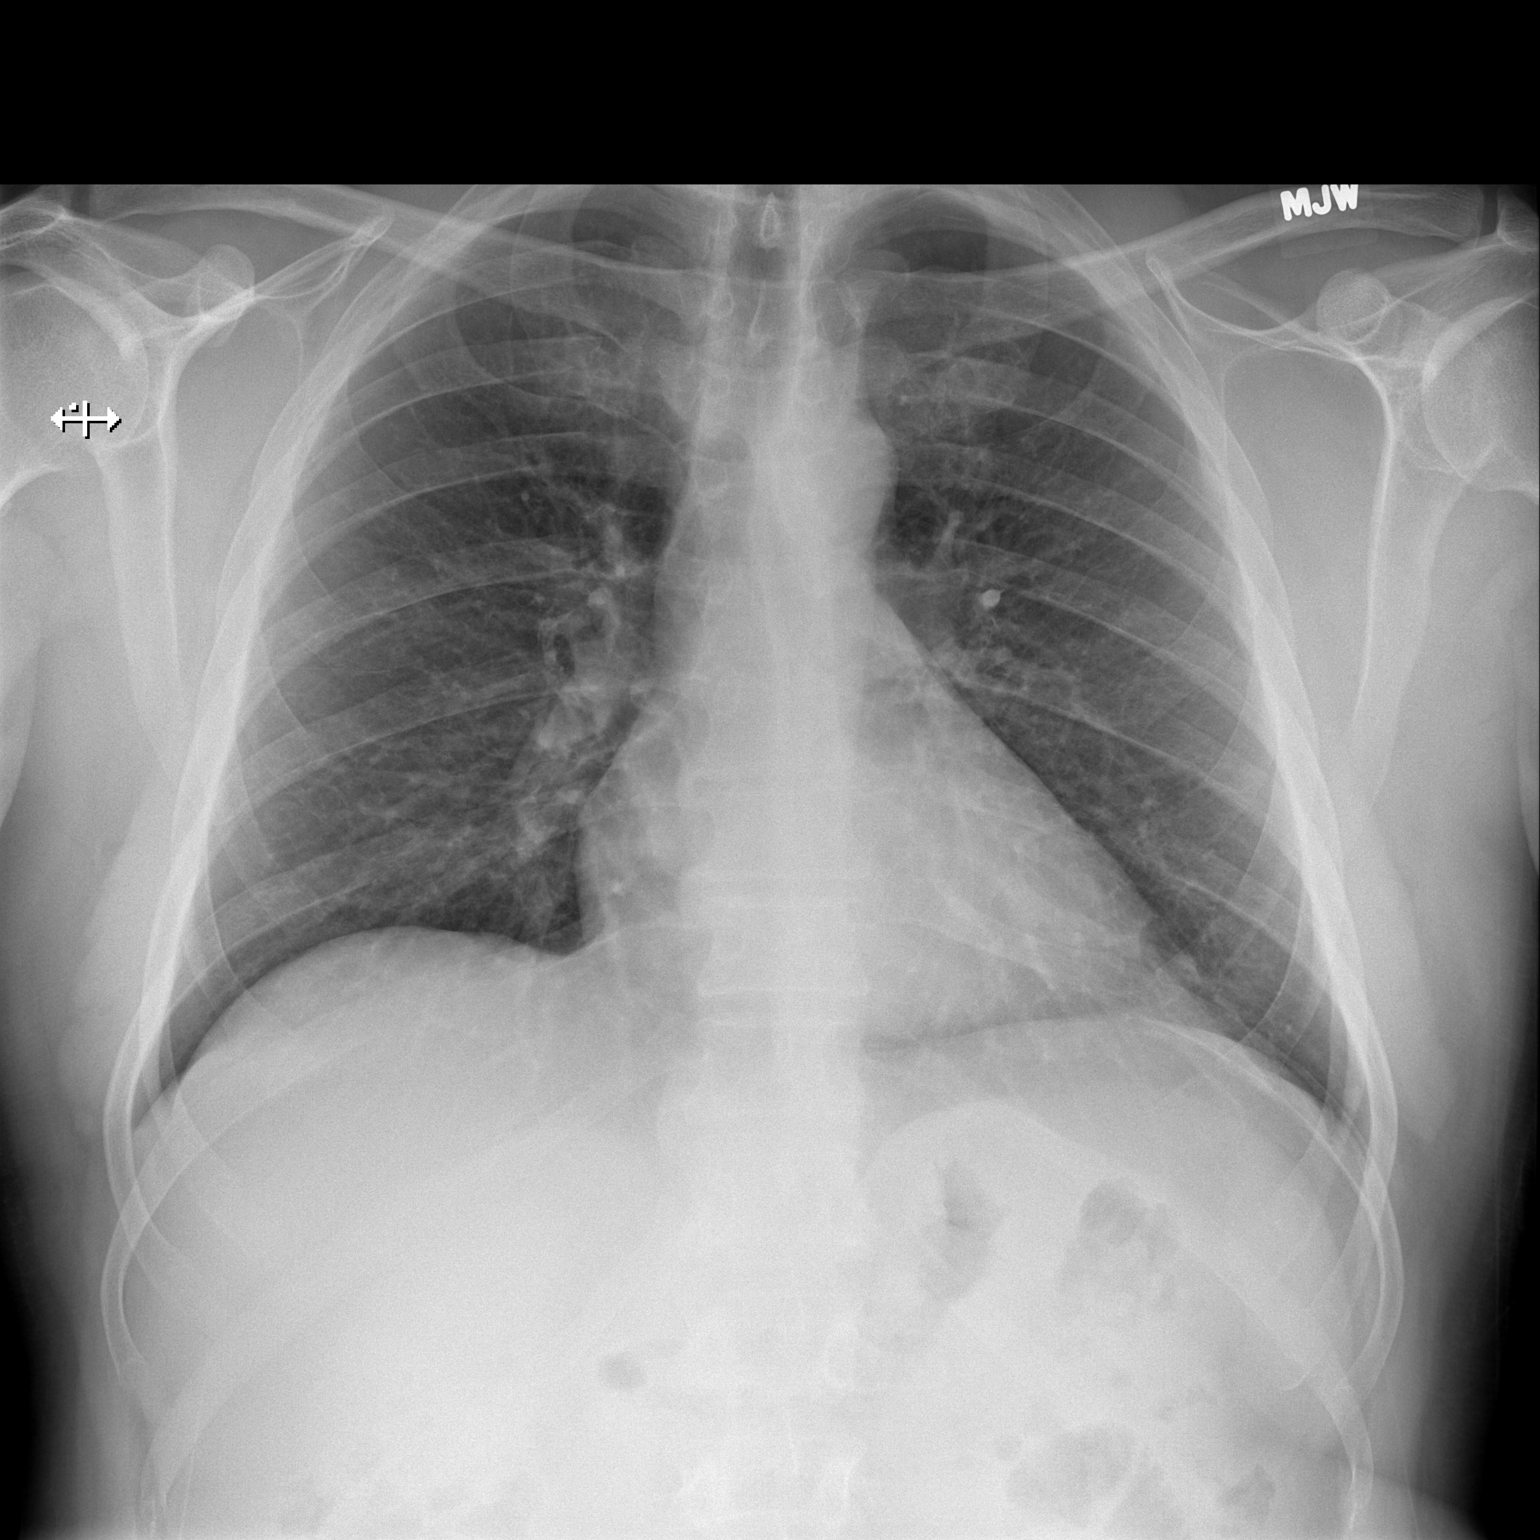

[w chest lat]
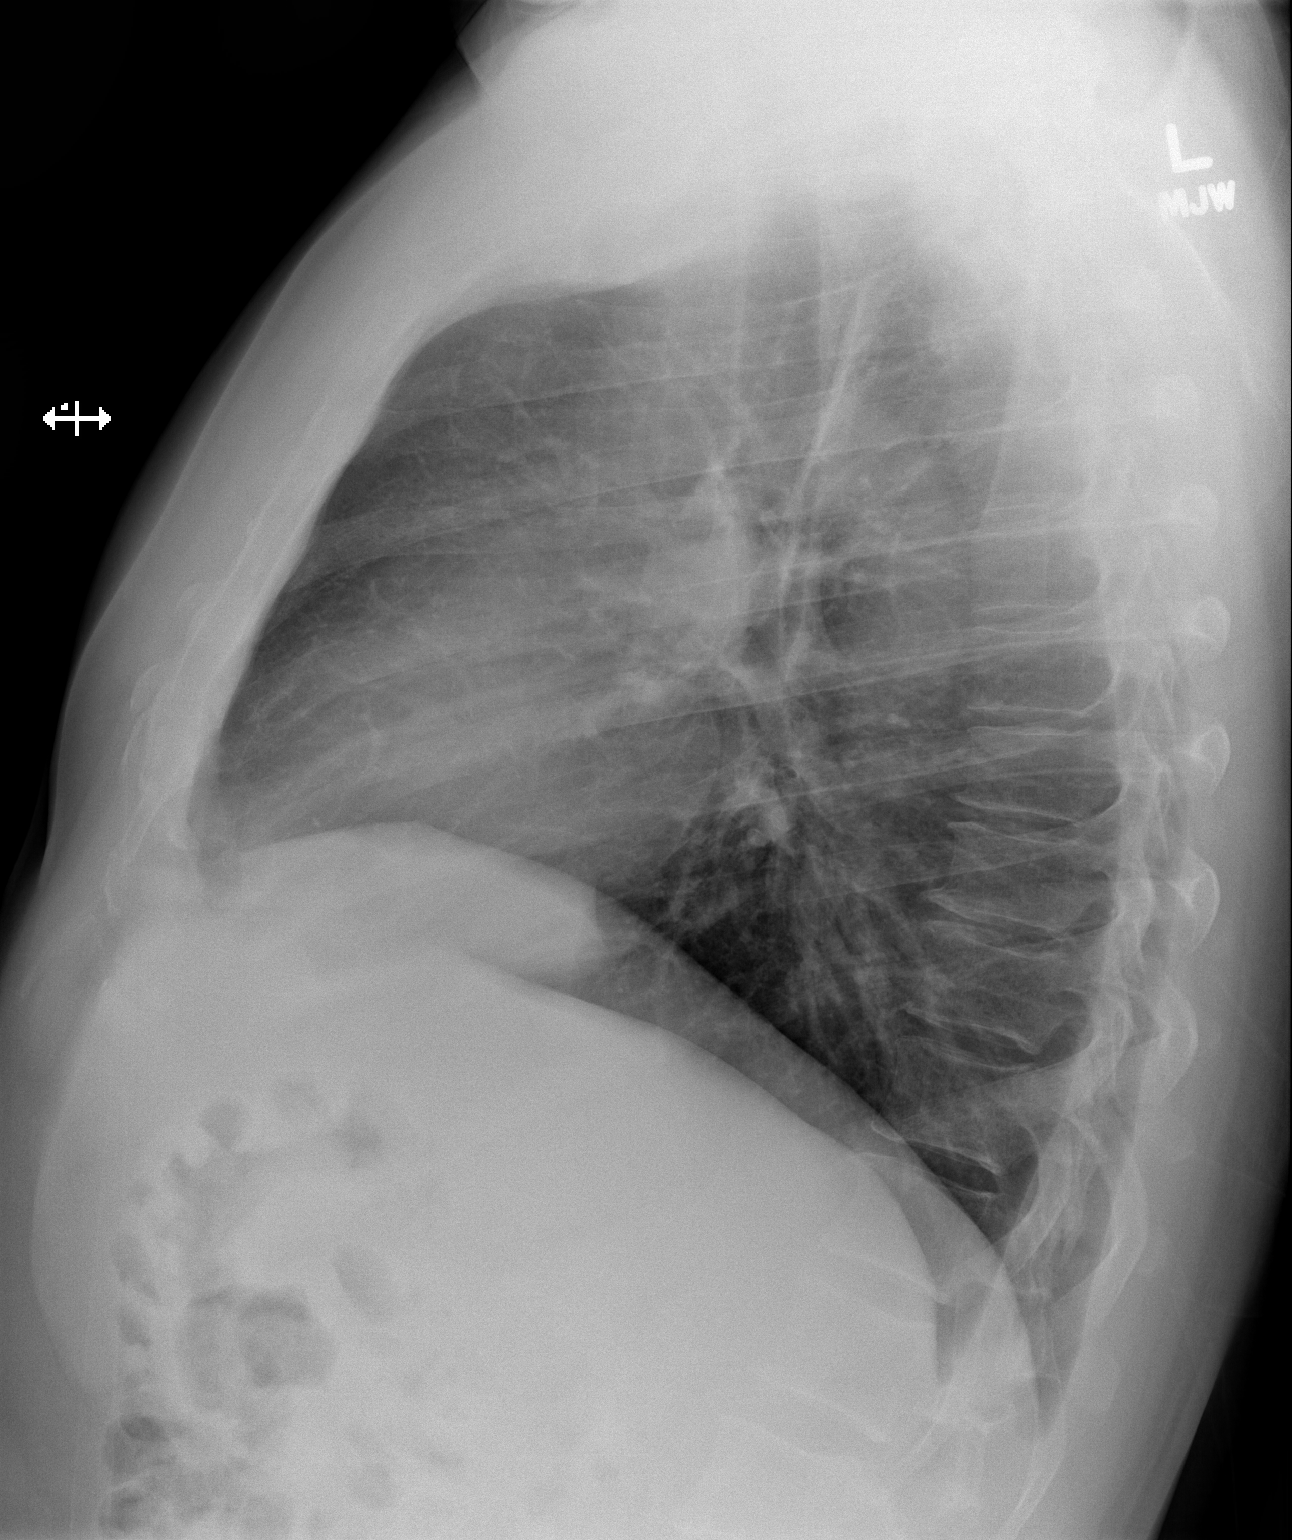

[2 of 2 positions shown; findings below may reference images not displayed]

FINDINGS: The heart size and mediastinal contours are within normal limits.
Both lungs are clear. The visualized skeletal structures are
unremarkable.
IMPRESSION: No active cardiopulmonary disease.

## 2014-09-23 IMAGING — RF DG C-ARM 61-120 MIN
1 series · 2 of 2 positions shown · non-contrast
Comparison: 12/21/2012

CLINICAL DATA: Lumbar fusion

EXAM:
LUMBAR SPINE - 2-3 VIEW; DG C-ARM 1-60 MIN

[Series 1: run · 2 of 2 slices shown]
[im 1/2]
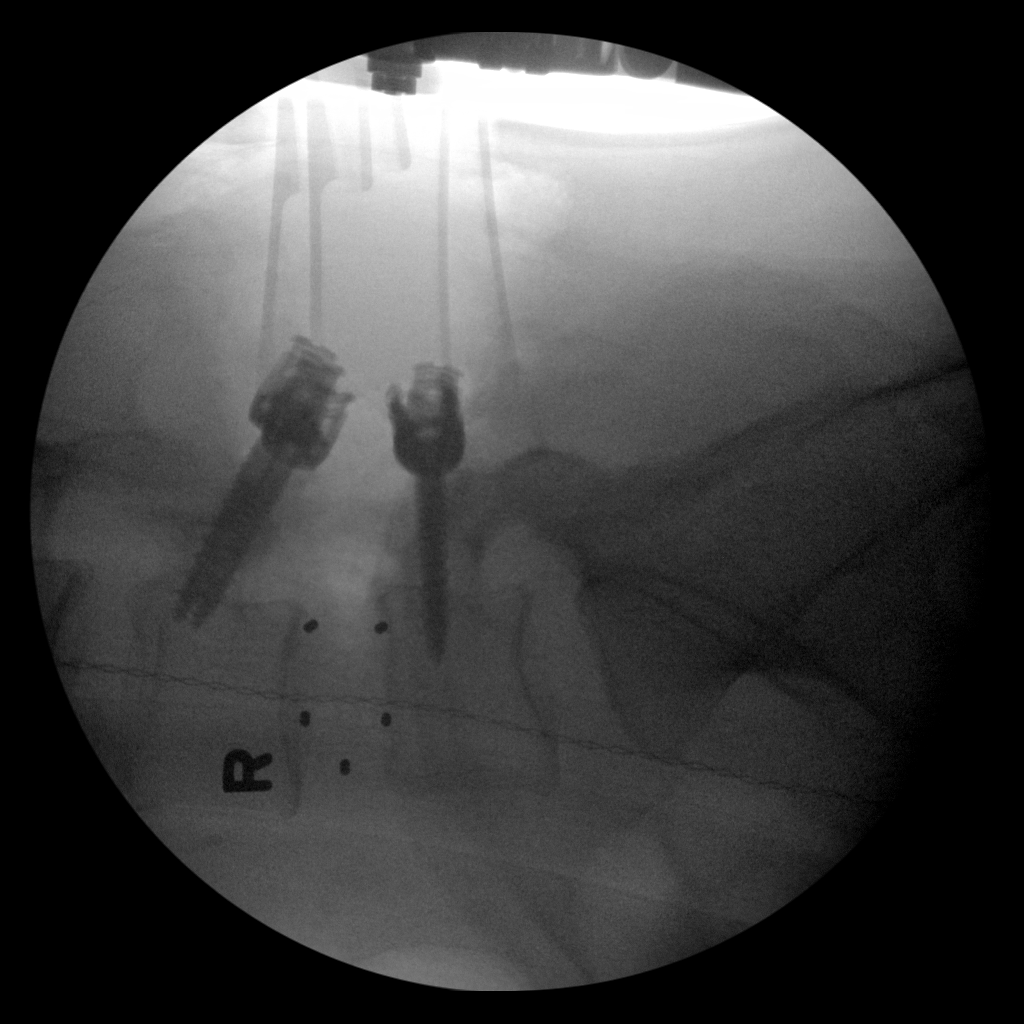
[im 2/2]
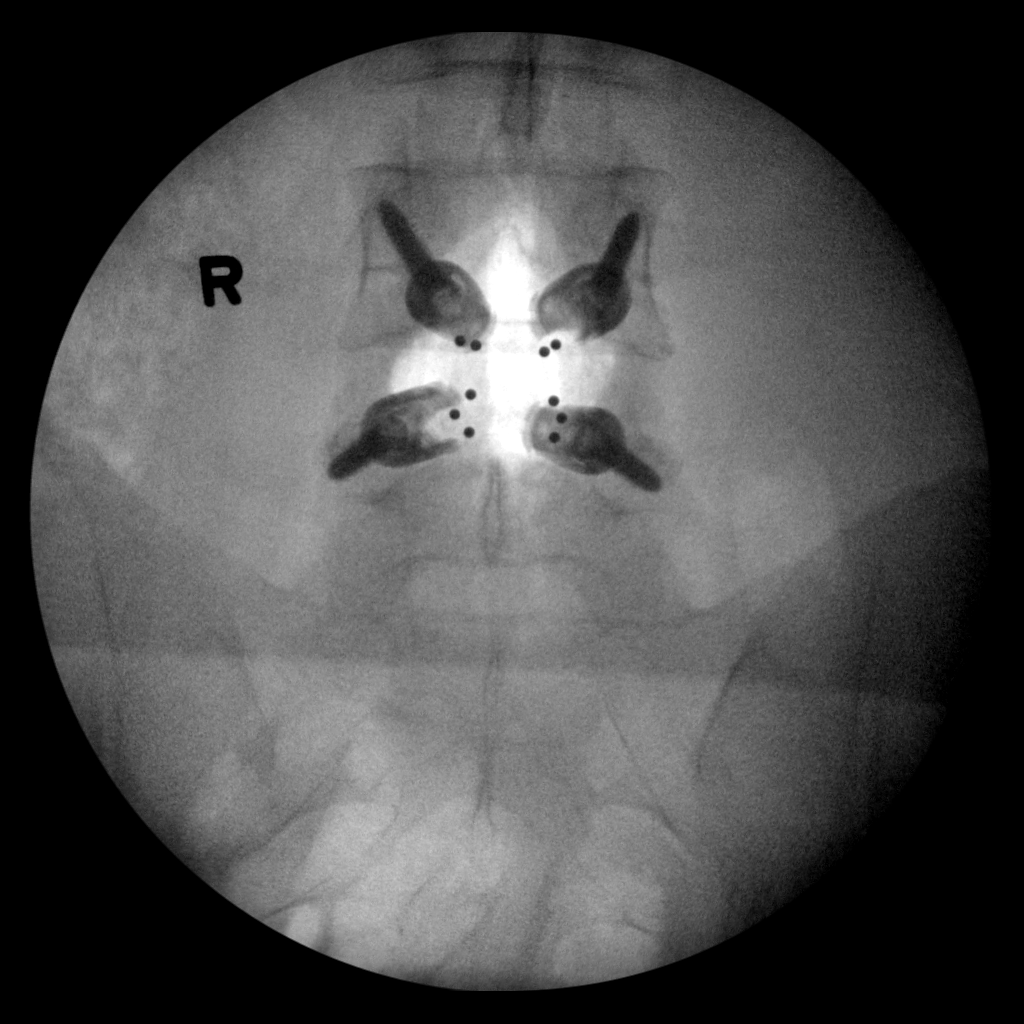

[2 of 2 positions shown; findings below may reference images not displayed]

FINDINGS: Two intraoperative fluoroscopic spot images document bilateral
pedicle screw placement L4 and L5. Graft markers project in the L4-5
interspace.
IMPRESSION: Changes of PLIF L4-5
